# Patient Record
Sex: Male | Born: 2000 | Race: White | Hispanic: No | Marital: Single | Smoking: Never smoker
Health system: Southern US, Community
[De-identification: ages and names within clinical notes are randomized; demographics above are authoritative.]

## PROBLEM LIST (undated history)

## (undated) DIAGNOSIS — K5090 Crohn's disease, unspecified, without complications: Secondary | ICD-10-CM

## (undated) HISTORY — PX: ADENOIDECTOMY: SHX5191

## (undated) HISTORY — PX: TONSILLECTOMY: SHX5217

## (undated) HISTORY — PX: TREATMENT FISTULA ANAL: SUR1390

---
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
## 2022-04-04 ENCOUNTER — Telehealth: Payer: Self-pay

## 2022-04-04 NOTE — Telephone Encounter (Signed)
Telephone call to patient today regarding his Humira that is ready for pick up.  He plans to pick up medication next week. Dahlia Bailiff, RN

## 2022-04-08 ENCOUNTER — Telehealth: Payer: Self-pay

## 2022-04-08 NOTE — Telephone Encounter (Signed)
Telephone call to patient to inquire about which day this week he planned to pick up his Humira medication.  Patient plans to pick up medication tomorrow around 10 am.  Dahlia Bailiff, RN

## 2022-04-09 NOTE — Telephone Encounter (Signed)
Humira medication picked up by patient today. Patient instructions included.   Patient reported he had proper transporting supplies for medication (cooler packs and cooler).  Dahlia Bailiff, RN

## 2022-07-02 ENCOUNTER — Telehealth: Payer: Self-pay

## 2022-07-02 NOTE — Telephone Encounter (Signed)
Telephone call to patient today regarding his Humira medication delivery today.  Left message for patient to please return my call at [phone].  Dahlia Bailiff, RN

## 2022-07-02 NOTE — Telephone Encounter (Signed)
Return call by patient today.  I notified him that his Humira was delivered today and he plans to come within the next 30 minutes to pick it up.  Dahlia Bailiff, RN

## 2022-07-04 ENCOUNTER — Other Ambulatory Visit: Payer: Self-pay

## 2022-07-04 ENCOUNTER — Encounter: Payer: Self-pay | Admitting: Medical

## 2022-07-04 ENCOUNTER — Ambulatory Visit (INDEPENDENT_AMBULATORY_CARE_PROVIDER_SITE_OTHER): Payer: BLUE CROSS/BLUE SHIELD | Admitting: Medical

## 2022-07-04 VITALS — BP 121/81 | HR 96 | Temp 98.5°F | Ht 68.9 in | Wt 137.0 lb

## 2022-07-04 DIAGNOSIS — J101 Influenza due to other identified influenza virus with other respiratory manifestations: Secondary | ICD-10-CM | POA: Diagnosis not present

## 2022-07-04 LAB — POC SOFIA 2 FLU + SARS ANTIGEN FIA
Influenza A, POC: POSITIVE — AB
Influenza B, POC: NEGATIVE
SARS Coronavirus 2 Ag: NEGATIVE

## 2022-07-04 MED ORDER — OSELTAMIVIR PHOSPHATE 75 MG PO CAPS: 75.0000 mg | ORAL_CAPSULE | Freq: Two times a day (BID) | ORAL | 0 refills | Status: AC

## 2022-07-04 NOTE — Patient Instructions (Signed)
You have tested positive for Influenza. Influenza is contagious to others through respiratory droplets (i.e. coughing and sneezing). You should avoid close contact with others when possible until your symptoms resolve. We recommend you do not attend class until your fever (100.4 F or above) has resolved for at least 24 hours (without taking fever-reducing medicine). In the meantime, when you must leave your room/apartment (to get food or medicine, visit bathroom), you should wear a well-fitting mask (ideally N95 or KN95).  Even when you do return to class, you should continue wearing a mask when around others (i.e. when going to class or visiting public places on or off campus) until your symptoms resolve.   When you do need to miss class due to illness, you should email your professors directly to notify them of your illness and make arrangements to complete any missed work.  If you have concerns about missing class or need other assistance, email [employer] [place] [employer] at [email or web address]. You can also look at their website: [email or web address]  -Rest and stay well hydrated (by drinking water and other liquids). Avoid/limit caffeine. -Take Tamiflu with food every 12 hours for 5 days. -Take over-the-counter medicines (i.e. Mucinex Fast Max All-in-One Day/Night) to help relieve your symptoms. -For your sore throat/cough, use cough drops/throat lozenges, gargle warm salt water and/or drink warm liquids (like tea with honey). -Send MyChart message to provider or schedule return visit as needed for new/worsening symptoms (i.e. shortness of breath, ear pain) or if your symptoms do not improve as discussed with recommended treatment over the next 5-7 days.

## 2022-07-04 NOTE — Progress Notes (Signed)
[address] 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  Sx began 3 nights ago. Has had cough, fatigue, nasal congestion, runny nose, mild sore throat and HA. Had fever few days ago, still noticing chills/sweats when Tylenol wears off. No myalgias. Some nausea, vomited once yesterday. Some liquid stools in last 24 hours. Had had decreased appetite. Denies shortness of breath or wheezing. Classmate he sits next to has flu.   Thinks he had flu shot. Taking Tylenol, last dose about 3.5 hrs ago. On Humira for Crohn's, last dose 3 days ago.   Current Medication:  Outpatient Encounter Medications as of 07/04/2022  Medication Sig   HUMIRA PEN 40 MG/0.4ML PNKT Inject 40 mg into the skin See admin instructions. Every 2 weeks   predniSONE (DELTASONE) 10 MG tablet Take 10 mg by mouth See admin instructions. Qd x 30 days   [DISCONTINUED] Cholecalciferol (VITAMIN D3) 1.25 MG (50000 UT) CAPS Take 50,000 mg by mouth every Sunday.   [DISCONTINUED] amoxicillin-clavulanate (AUGMENTIN) 875-125 MG tablet Take 1 tablet by mouth 2 (two) times daily. One po bid x 7 days   [DISCONTINUED] budesonide (ENTOCORT EC) 3 MG 24 hr capsule Take 9 mg by mouth every morning.   [DISCONTINUED] CREON 12000-38000 units CPEP capsule Take 12,000 Units by mouth daily as needed (gluten allergy).   [DISCONTINUED] doxycycline (VIBRAMYCIN) 100 MG capsule Take 1 capsule (100 mg total) by mouth 2 (two) times daily. One po bid x 7 days   [DISCONTINUED] mesalamine (APRISO) 0.375 g 24 hr capsule Take 3 capsules by mouth daily.   [DISCONTINUED] omeprazole (PRILOSEC) 40 MG capsule Take 40 mg by mouth 2 (two) times  daily.   [DISCONTINUED] oxyCODONE-acetaminophen (PERCOCET) 5-325 MG tablet Take 1 tablet by mouth every 8 (eight) hours as needed for severe pain.   No facility-administered encounter medications on file as of 07/04/2022.      Medical History: Past Medical History:  Diagnosis Date   Crohn disease ([place])      Vital Signs: BP 121/81   Pulse 96   Temp 98.5 F (36.9 C) (Tympanic)   Ht 5' 8.9" (1.75 m)   Wt 137 lb (62.1 kg)   SpO2 97%   BMI 20.29 kg/m    Review of Systems See HPI  Physical Exam Vitals reviewed.  Constitutional:      General: He is not in acute distress.    Appearance: He is ill-appearing (mildly).  HENT:     Head: Normocephalic.     Right Ear: Ear canal and external ear normal.     Left Ear: Ear canal and external ear normal.     Ears:     Comments: TMs slightly dull    Nose: Mucosal edema, congestion and rhinorrhea present. Rhinorrhea is clear.     Mouth/Throat:     Mouth: Mucous membranes are moist. No oral lesions.     Pharynx: Posterior oropharyngeal erythema (mild) present. No pharyngeal swelling.     Comments: Tonsils surgically absent Cardiovascular:     Rate and Rhythm: Normal rate and regular rhythm.     Heart sounds: No murmur heard.    No friction rub. No gallop.  Pulmonary:     Effort: Pulmonary effort is normal.     Breath sounds: No decreased air movement. No rhonchi or rales.     Comments: Few scattered short wheezes bilaterally. Musculoskeletal:     Cervical back: Neck supple. No rigidity.  Lymphadenopathy:     Cervical: Cervical adenopathy (1+ anterior nodes, slightly tender) present.  Neurological:     Mental Status: He is alert.     Results for orders placed or performed in visit on 07/04/22 (from the past 24 hour(s))  POC SOFIA 2 FLU + SARS ANTIGEN FIA     Status: Abnormal   Collection Time: 07/04/22 10:53 AM  Result Value Ref Range   Influenza A, POC Positive (A) Negative   Influenza B, POC Negative Negative   SARS  Coronavirus 2 Ag Negative Negative     Assessment/Plan: 1. Influenza A POC Influenza A test positive. Clinical findings consistent with flu. Discussed potential benefit and adverse effects of Tamiflu.  Patient elected to take antiviral treatment. Encouraged supportive measures and over-the-counter meds as needed for symptom relief.  Patient declined albuterol inhaler.   - POC SOFIA 2 FLU + SARS ANTIGEN FIA - oseltamivir (TAMIFLU) 75 MG capsule; Take 1 capsule (75 mg total) by mouth 2 (two) times daily for 5 days.  Dispense: 10 capsule; Refill: 0   Patient Instructions:  You have tested positive for Influenza. Influenza is contagious to others through respiratory droplets (i.e. coughing and sneezing). You should avoid close contact with others when possible until your symptoms resolve. We recommend you do not attend class until your fever (100.4 F or above) has resolved for at least 24 hours (without taking fever-reducing medicine). In the meantime, when you must leave your room/apartment (to get food or medicine, visit bathroom), you should wear a well-fitting mask (ideally N95 or KN95).  Even when you do return to class, you should continue wearing a mask when around others (i.e. when going to class or visiting public places on or off campus) until your symptoms resolve.   When you do need to miss class due to illness, you should email your professors directly to notify them of your illness and make arrangements to complete any missed work.  If you have concerns about missing class or need other assistance, email [place] [employer] at [email or web address]. You can also look at their website: [email or web address]  -Rest and stay well hydrated (by drinking water and other liquids). Avoid/limit caffeine. -Take Tamiflu with food every 12 hours for 5 days. -Take over-the-counter medicines (i.e.  Mucinex Fast Max All-in-One Day/Night) to help relieve your symptoms. -For your sore throat/cough, use cough drops/throat lozenges, gargle warm salt water and/or drink warm liquids (like tea with honey). -Send MyChart message to provider or schedule return visit as needed for new/worsening symptoms (i.e. shortness of breath, ear pain) or if your symptoms do not improve as discussed with recommended treatment over the next 5-7 days.    General Counseling: dionysios pfitzer understanding of the findings of todays visit and agrees with plan of treatment. he has been encouraged to call the office with any questions or concerns that should arise related to todays visit.   Orders Placed This Encounter  Procedures   POC SOFIA 2 FLU + SARS ANTIGEN FIA    No orders of the defined types were placed in this encounter.   Time spent:[address] PA-C [place] 07/04/2022 10:29 AM

## 2022-07-09 ENCOUNTER — Encounter: Payer: Self-pay | Admitting: Medical

## 2022-07-09 ENCOUNTER — Ambulatory Visit (INDEPENDENT_AMBULATORY_CARE_PROVIDER_SITE_OTHER): Payer: BLUE CROSS/BLUE SHIELD | Admitting: Medical

## 2022-07-09 ENCOUNTER — Other Ambulatory Visit: Payer: Self-pay

## 2022-07-09 VITALS — BP 99/61 | HR 103 | Temp 98.7°F | Ht 68.9 in | Wt 137.0 lb

## 2022-07-09 DIAGNOSIS — J101 Influenza due to other identified influenza virus with other respiratory manifestations: Secondary | ICD-10-CM | POA: Diagnosis not present

## 2022-07-09 DIAGNOSIS — J22 Unspecified acute lower respiratory infection: Secondary | ICD-10-CM

## 2022-07-09 DIAGNOSIS — J069 Acute upper respiratory infection, unspecified: Secondary | ICD-10-CM | POA: Diagnosis not present

## 2022-07-09 LAB — POCT RAPID STREP A (OFFICE): Rapid Strep A Screen: NEGATIVE

## 2022-07-09 MED ORDER — ALBUTEROL SULFATE HFA 108 (90 BASE) MCG/ACT IN AERS: 1.0000 | INHALATION_SPRAY | RESPIRATORY_TRACT | 0 refills | Status: DC | PRN

## 2022-07-09 MED ORDER — AMOXICILLIN-POT CLAVULANATE 875-125 MG PO TABS: 1.0000 | ORAL_TABLET | Freq: Two times a day (BID) | ORAL | 0 refills | Status: DC

## 2022-07-09 NOTE — Patient Instructions (Addendum)
-  Take complete course of antibiotics as prescribed.  Take with food.   -Use albuterol inhaler every 4-6 hours as needed for shortness of breath, wheezing, chest tightness or persistent cough.  -Rest and stay well hydrated (by drinking water and other liquids).  -Take over-the-counter medicines (i.e. Mucinex DM, Sudafed, Tylenol) to help relieve your symptoms. -For your sore throat/cough, use cough drops/throat lozenges, gargle warm salt water and/or drink warm liquids (like tea with honey). -Return for follow up visit in 48 hours. -Send MyChart message to provider or schedule earlier visit as needed for new/worsening symptoms (i.e. increased shortness of breath).

## 2022-07-09 NOTE — Progress Notes (Signed)
[address], [place] [address] Phone: [phone] Fax: [phone]   Office Visit Note  Patient Name: James Ball  Date of [id number]  Med Rec number [id number]  Date of Service: 07/09/2022  Allergies: Gluten meal and Clindamycin  Chief Complaint  Patient presents with   Sore Throat     HPI 22 YO college student presents for persistent cough and sore throat, recurring fever.  See previous note. Dx with flu 07/04/22, given Tamiflu. Finished Tamiflu yesterday. Fever had gone away, subjectively during first three days on Tamiflu. Still had sore throat, cough and congestion. Had fever again yesterday, 100.7.   Cough sometimes dry and other times feels phlegmy. Difficult to expectorate mucus. Some yellow/green nasal d/c. Has continued to have a lot of fatigue. Has had sweats at night last few nights. Sore throat has also not improved.   Still has low appetite and fatigue. Not really SOB.  Using Chloraseptic lozenges, Tylenol and saline nasal spray. Last took Tylenol about 2 hours ago.  Hx of Crohn's disease, had last dose of Humira 8 days ago.   Current Medication:  Outpatient Encounter Medications as of 07/09/2022  Medication Sig   HUMIRA PEN 40 MG/0.4ML PNKT Inject 40 mg into the skin See admin instructions. Every 2 weeks   oseltamivir (TAMIFLU) 75 MG capsule Take 1 capsule (75 mg total) by mouth 2 (two) times daily for 5 days.   No facility-administered encounter medications on file as of 07/09/2022.      Medical History: Past Medical History:  Diagnosis Date   Crohn disease ([facility])      Vital Signs: BP 99/61   Pulse (!) 103   Temp 98.7 F (37.1 C) (Tympanic)   Ht 5' 8.9" (1.75 m)   Wt 137 lb (62.1 kg)   SpO2 97%   BMI 20.29 kg/m    Review of Systems  Constitutional:  Positive for chills, fatigue and fever.  HENT:  Positive for congestion, rhinorrhea and sore throat. Negative for sinus pain.   Respiratory:  Positive for  cough. Negative for shortness of breath.   Cardiovascular:  Negative for chest pain.  Neurological:  Positive for headaches (occasional).    Physical Exam Vitals reviewed.  Constitutional:      General: He is not in acute distress.    Appearance: He is ill-appearing (mildly).  HENT:     Head: Normocephalic.     Right Ear: Ear canal and external ear normal.     Left Ear: Ear canal and external ear normal.     Ears:     Comments: TMs dull bilaterally, small serous middle ear fluid.    Nose: Mucosal edema, congestion and rhinorrhea present. Rhinorrhea is purulent.     Right Turbinates: Swollen.     Left Turbinates: Swollen.     Right Sinus: No maxillary sinus tenderness or frontal sinus tenderness.     Left Sinus: No maxillary sinus tenderness or frontal sinus tenderness.     Mouth/Throat:     Mouth: Mucous membranes are moist. No oral lesions.     Pharynx: Posterior oropharyngeal erythema (moderate) present. No pharyngeal swelling.     Comments: Tonsils surgically absent Cardiovascular:     Rate and Rhythm: Regular rhythm. Tachycardia present.     Heart sounds: No murmur heard.    No friction rub. No gallop.  Pulmonary:     Effort: Pulmonary effort is normal.     Breath sounds: Decreased air movement (to lower lung  fields bilaterally, L>R) present. Examination of the right-middle field reveals wheezing. Examination of the left-middle field reveals wheezing. Examination of the right-lower field reveals wheezing and rales. Examination of the left-lower field reveals wheezing and rales. Wheezing and rales present. No rhonchi.  Musculoskeletal:     Cervical back: Neck supple. No rigidity.  Lymphadenopathy:     Cervical: Cervical adenopathy (1+ anterior cervical nodes, mildly tender) present.  Neurological:     Mental Status: He is alert.    Results for orders placed or performed in visit on 07/09/22 (from the past 24 hour(s))  POCT rapid strep A     Status: Normal   Collection  Time: 07/09/22 10:22 AM  Result Value Ref Range   Rapid Strep A Screen Negative Negative     Assessment/Plan: 1. Acute upper respiratory infection 2. Lower respiratory infection 3. Influenza A Concerned for secondary bacterial infection. Will start Augmentin. Administered albuterol nebulizer treatment in clinic, patient noted some improvement with this. Will give albuterol inhaler to use every 4-6 hours. Discussed additional supportive and symptomatic treatment. Will have patient follow up in 48 hours to re-evaluate.  - POCT rapid strep A - amoxicillin-clavulanate (AUGMENTIN) 875-125 MG tablet; Take 1 tablet by mouth 2 (two) times daily.  Dispense: 20 tablet; Refill: 0 - albuterol (VENTOLIN HFA) 108 (90 Base) MCG/ACT inhaler; Inhale 1-2 puffs into the lungs every 4 (four) hours as needed for wheezing or shortness of breath (or cough).  Dispense: 1 each; Refill: 0      General Counseling: Merrilee Jansky understanding of the findings of todays visit and agrees with plan of treatment. he has been encouraged to call the office with any questions or concerns that should arise related to todays visit.   Orders Placed This Encounter  Procedures   POCT rapid strep A    Time spent:25 [place] PA-C [place] 07/09/2022 9:09 AM

## 2022-07-11 ENCOUNTER — Ambulatory Visit (INDEPENDENT_AMBULATORY_CARE_PROVIDER_SITE_OTHER): Payer: BLUE CROSS/BLUE SHIELD | Admitting: Medical

## 2022-07-12 ENCOUNTER — Telehealth: Payer: Self-pay

## 2022-07-12 NOTE — Telephone Encounter (Signed)
Patient called at 10:30 am on 07/12/2022 regarding missed appt on 07/11/2022. Patient reports feeling better and does not want to schedule a follow up at this time.

## 2022-08-08 IMAGING — CT CT ABD-PELV W/ CM
3 of 4 series · 11 of 46 positions shown, 16 images · IV contrast (APPLIED)
Comparison: None.

CLINICAL DATA: History of Crohn's disease. Painful lump in gluteal
folds.

EXAM:
CT ABDOMEN AND PELVIS WITH CONTRAST
TECHNIQUE: Multidetector CT imaging of the abdomen and pelvis was performed
using the standard protocol following bolus administration of
intravenous contrast.
CONTRAST:  100mL OMNIPAQUE IOHEXOL 300 MG/ML  SOLN

[Series 3: abdomen 5.0 · axial · 0.73mm/px · z∈[+794,+1154]mm · 7 of 97 slices shown, 12 images]
[im 13/97  soft-tissue]
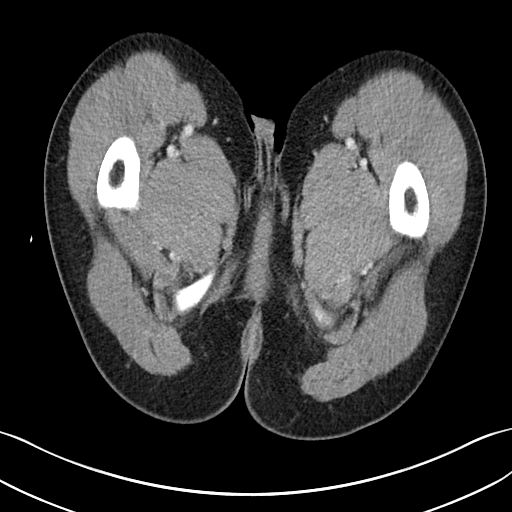
[im 13/97  bone]
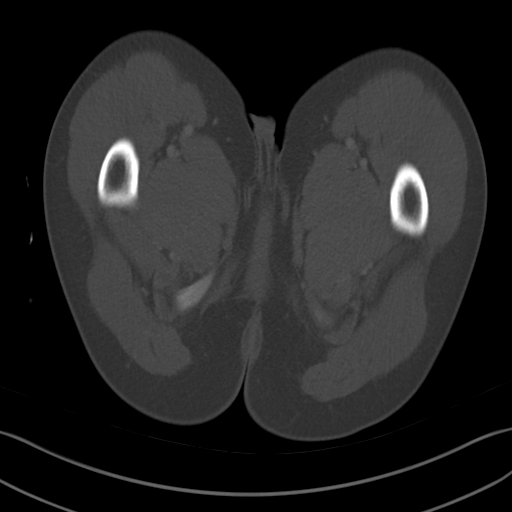
[im 25/97  soft-tissue]
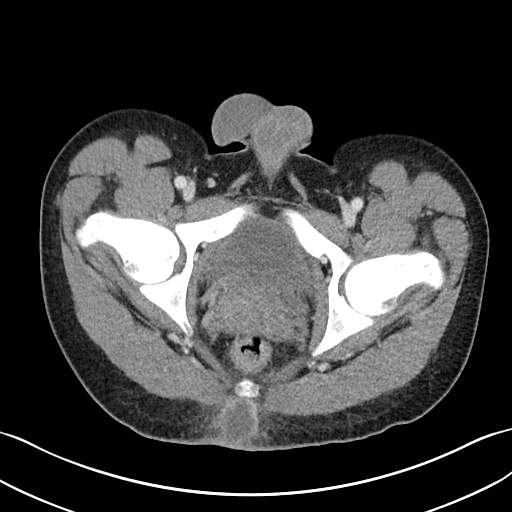
[im 37/97  soft-tissue]
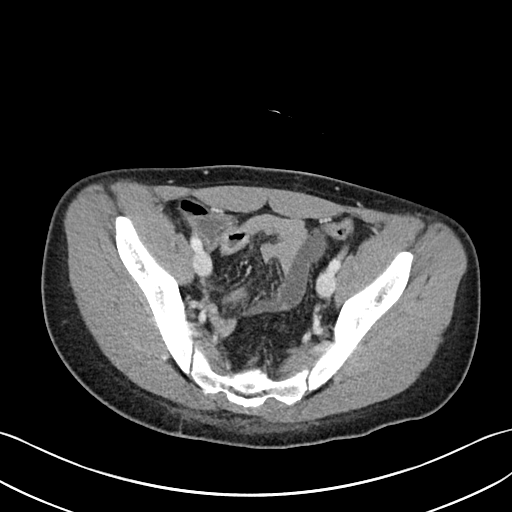
[im 49/97  soft-tissue]
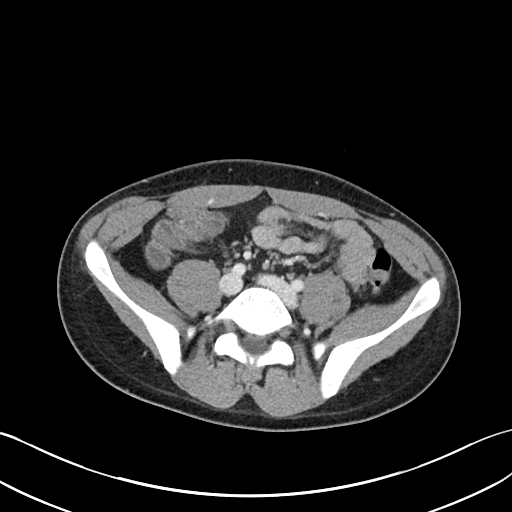
[im 49/97  lung]
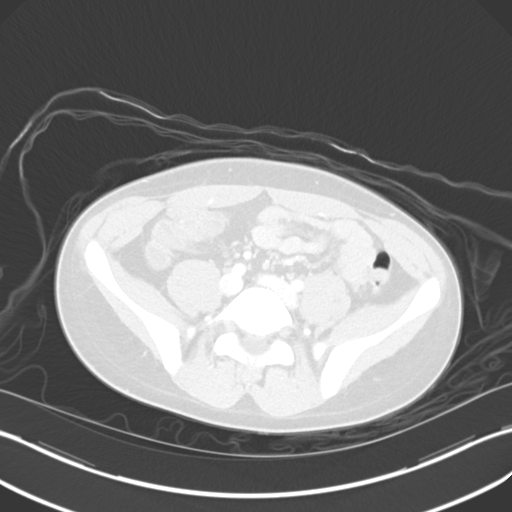
[im 61/97  soft-tissue]
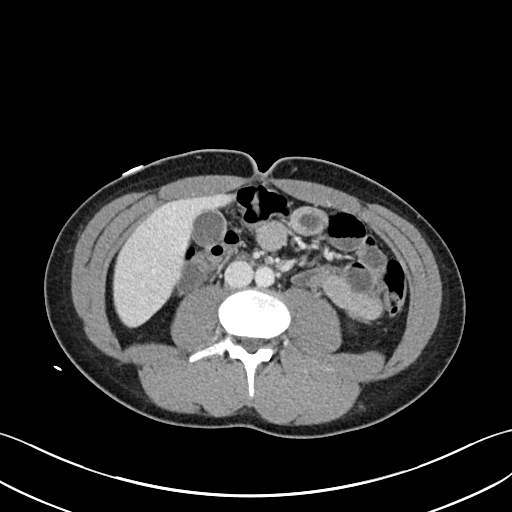
[im 61/97  lung]
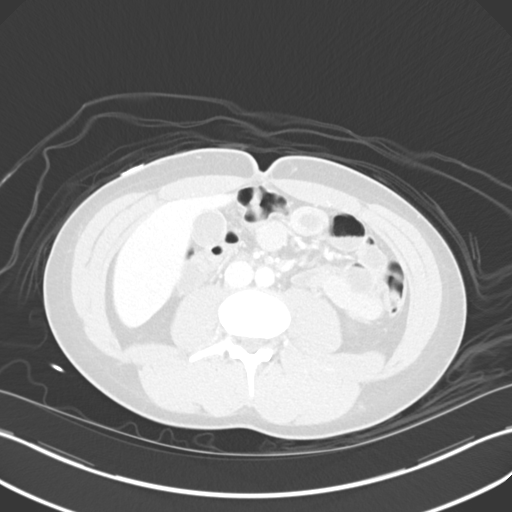
[im 73/97  soft-tissue]
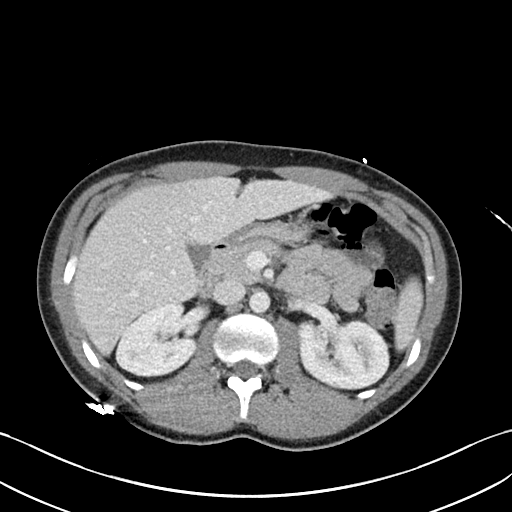
[im 73/97  lung]
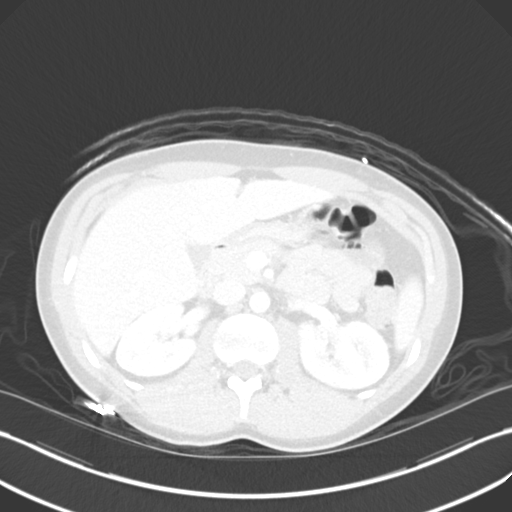
[im 85/97  soft-tissue]
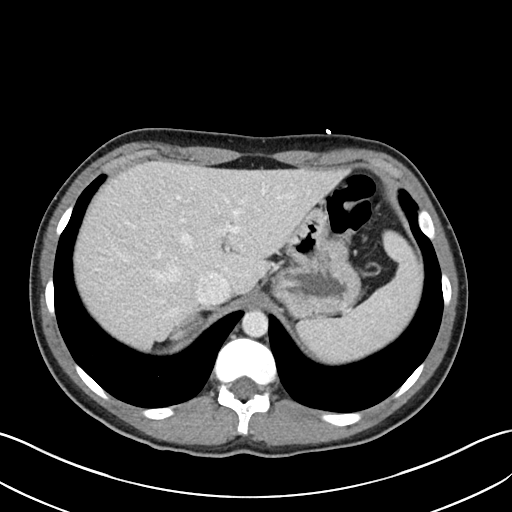
[im 85/97  lung]
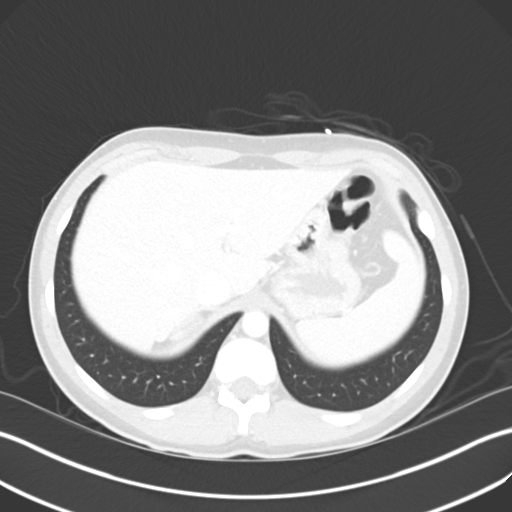

[Series 6: abdomen 3.0 mpr cor · coronal · 0.68mm/px · 3 of 112 slices shown]
[im 38/112  soft-tissue]
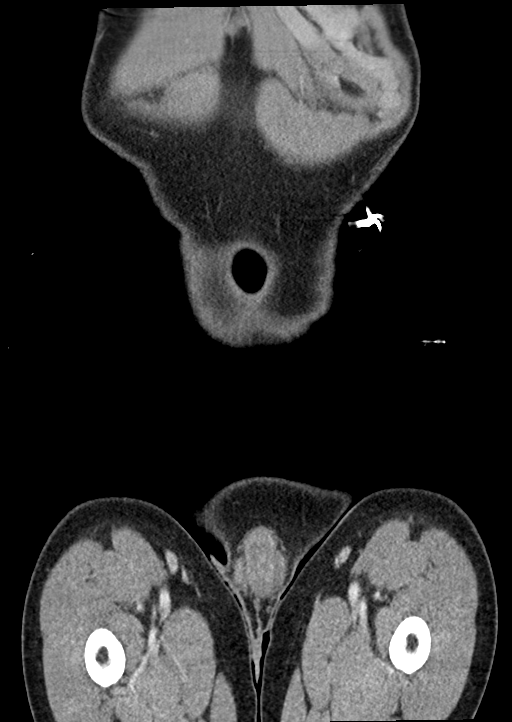
[im 50/112  soft-tissue]
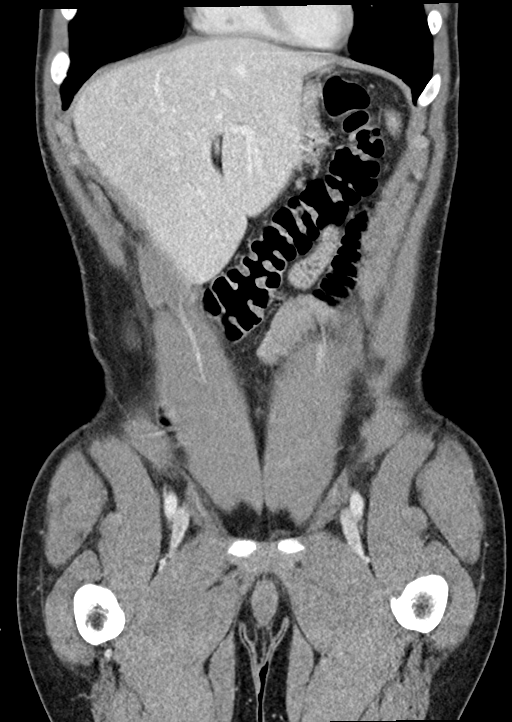
[im 62/112  soft-tissue]
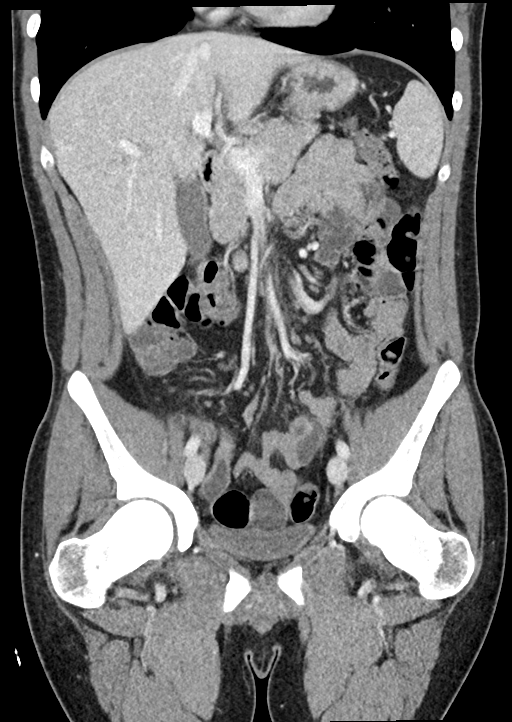

[Series 7: abdomen 3.0 mpr sag · sagittal · 0.65mm/px · 1 of 112 slices shown]
[im 38/112  soft-tissue]
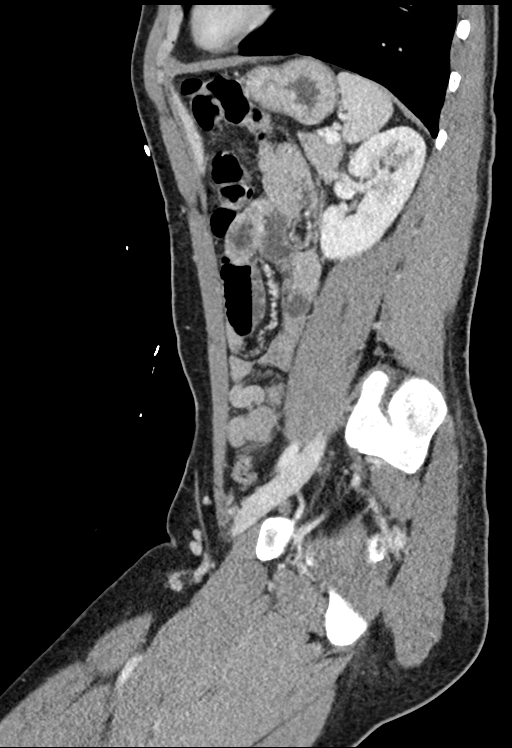

[11 of 46 positions shown; findings below may reference images not displayed]

FINDINGS: Lower chest: Lung bases are clear. No effusions. Heart is normal
size.

Hepatobiliary: No focal hepatic abnormality. Gallbladder
unremarkable.

Pancreas: No focal abnormality or ductal dilatation.

Spleen: No focal abnormality.  Normal size.

Adrenals/Urinary Tract: No adrenal abnormality. No focal renal
abnormality. No stones or hydronephrosis. Urinary bladder is
unremarkable.

Stomach/Bowel: Short segment small bowel intussusception noted in
the midline of the upper abdomen, likely transient and not of
clinical significance. No bowel obstruction. No areas of wall
thickening.

Vascular/Lymphatic: No evidence of aneurysm or adenopathy.

Reproductive: No visible focal abnormality.

Other: No free fluid or free air. Within the superior gluteal fold,
there is a fluid collection within the subcutaneous soft tissues
extending near the tip of the coccyx measuring 2.9 x 2.6 cm.
Surrounding inflammatory stranding. No visible connection to bowel
to suggest fistula.

Musculoskeletal: No acute bony abnormality.
IMPRESSION: Fluid collection within the superior gluteal fold subcutaneous soft
tissues extending down near the coccyx concerning for subcutaneous
abscess.

Short segment small bowel intussusception in the upper abdomen,
likely transient and not of relevance. No bowel obstruction.

No evidence for active Crohn's disease.

## 2022-09-03 ENCOUNTER — Encounter: Payer: Self-pay | Admitting: Oncology

## 2022-09-03 ENCOUNTER — Ambulatory Visit (INDEPENDENT_AMBULATORY_CARE_PROVIDER_SITE_OTHER): Payer: BLUE CROSS/BLUE SHIELD | Admitting: Oncology

## 2022-09-03 ENCOUNTER — Other Ambulatory Visit: Payer: Self-pay

## 2022-09-03 VITALS — HR 93 | Temp 97.8°F

## 2022-09-03 DIAGNOSIS — K50919 Crohn's disease, unspecified, with unspecified complications: Secondary | ICD-10-CM | POA: Diagnosis not present

## 2022-09-03 NOTE — Progress Notes (Signed)
[name] [address] [name], [place] [address] Phone: [phone] Fax: [phone]  Office Visit Note  Patient Name: James Ball  Date of [id number]  Med Rec number [id number]  Date of Service: 09/03/2022  Gluten meal and Clindamycin  No chief complaint on file.  Patient is an 22 y.o. student here for a lab draw.   Diagnosed with chron's disease when he was in 3rd grade. Gets labs drawn every 6 months. Does not have flares often. 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   No concerns today.  Current Medication:  Outpatient Encounter Medications as of 09/03/2022  Medication Sig   albuterol (VENTOLIN HFA) 108 (90 Base) MCG/ACT inhaler Inhale 1-2 puffs into the lungs every 4 (four) hours as needed for wheezing or shortness of breath (or cough).   amoxicillin-clavulanate (AUGMENTIN) 875-125 MG tablet Take 1 tablet by mouth 2 (two) times daily.   HUMIRA PEN 40 MG/0.4ML PNKT Inject 40 mg into the skin See admin instructions. Every 2 weeks   No facility-administered encounter medications on file as of 09/03/2022.     Medical History: Past Medical History:  Diagnosis Date   Crohn disease (HCC)    Vital Signs: There were no vitals taken for this visit.  ROS: As per HPI.  All other pertinent ROS negative.     Review of Systems  Constitutional: Negative.     Physical Exam Constitutional:      Appearance: Normal appearance.  Neurological:     Mental Status: He is alert and oriented to person, place, and time.    No results found for this or any previous visit (from the past 24 hour(s)).  Assessment/Plan: 1. Crohn's disease with complication, unspecified gastrointestinal tract location -Labs today. -Order from [facility] gastroenterology and nutrition copied and will be placed in epic. -Results are to be faxed to James Ball at [phone]. -Send results to student and provider when available.  - CBC w/Diff; Future - C-reactive protein;  Future - Sedimentation rate; Future - Comp Met (CMET); Future - Vitamin D (25 hydroxy); Future - Iron and TIBC([facility]); Future  Disposition-return to clinic as needed.  General Counseling: James Ball understanding of the findings of todays visit and agrees with plan of treatment. I have discussed any further diagnostic evaluation that may be needed or ordered today. We also reviewed his medications today. he has been encouraged to call the office with any questions or concerns that should arise related to todays visit.   No orders of the defined types were placed in this encounter.   No orders of the defined types were placed in this encounter.   I spent 20 minutes dedicated to the care of this patient (face-to-face and non-face-to-face) on the date of the encounter to include what is described in the assessment and plan.   James Hurt, NP 09/03/2022 8:27 AM

## 2022-09-04 LAB — VITAMIN D 25 HYDROXY (VIT D DEFICIENCY, FRACTURES): Vit D, 25-Hydroxy: 20.8 ng/mL — ABNORMAL LOW (ref 30.0–100.0)

## 2022-09-04 LAB — COMPREHENSIVE METABOLIC PANEL
ALT: 22 IU/L (ref 0–44)
AST: 23 IU/L (ref 0–40)
Albumin/Globulin Ratio: 1.9 (ref 1.2–2.2)
Albumin: 4.7 g/dL (ref 4.3–5.2)
Alkaline Phosphatase: 105 IU/L (ref 44–121)
BUN/Creatinine Ratio: 20 (ref 9–20)
BUN: 12 mg/dL (ref 6–20)
Bilirubin Total: <0.2 mg/dL (ref 0.0–1.2)
CO2: 19 mmol/L — ABNORMAL LOW (ref 20–29)
Calcium: 9.3 mg/dL (ref 8.7–10.2)
Chloride: 101 mmol/L (ref 96–106)
Creatinine, Ser: 0.61 mg/dL — ABNORMAL LOW (ref 0.76–1.27)
Globulin, Total: 2.5 g/dL (ref 1.5–4.5)
Glucose: 93 mg/dL (ref 70–99)
Potassium: 4.4 mmol/L (ref 3.5–5.2)
Sodium: 140 mmol/L (ref 134–144)
Total Protein: 7.2 g/dL (ref 6.0–8.5)
eGFR: 140 mL/min/{1.73_m2} (ref >59)

## 2022-09-04 LAB — IRON AND TIBC
Iron Saturation: 25 % (ref 15–55)
Iron: 95 ug/dL (ref 38–169)
Total Iron Binding Capacity: 383 ug/dL (ref 250–450)
UIBC: 288 ug/dL (ref 111–343)

## 2022-09-04 LAB — CBC WITH DIFFERENTIAL/PLATELET
Basophils Absolute: 0.1 10*3/uL (ref 0.0–0.2)
Basos: 0 %
EOS (ABSOLUTE): 0.3 10*3/uL (ref 0.0–0.4)
Eos: 2 %
Hematocrit: 41.3 % (ref 37.5–51.0)
Hemoglobin: 13.2 g/dL (ref 13.0–17.7)
Immature Grans (Abs): 0.1 10*3/uL (ref 0.0–0.1)
Immature Granulocytes: 1 %
Lymphocytes Absolute: 3.7 10*3/uL — ABNORMAL HIGH (ref 0.7–3.1)
Lymphs: 31 %
MCH: 30.1 pg (ref 26.6–33.0)
MCHC: 32 g/dL (ref 31.5–35.7)
MCV: 94 fL (ref 79–97)
Monocytes Absolute: 0.5 10*3/uL (ref 0.1–0.9)
Monocytes: 4 %
Neutrophils Absolute: 7.4 10*3/uL — ABNORMAL HIGH (ref 1.4–7.0)
Neutrophils: 62 %
Platelets: 271 10*3/uL (ref 150–450)
RBC: 4.39 x10E6/uL (ref 4.14–5.80)
RDW: 13.4 % (ref 11.6–15.4)
WBC: 12 10*3/uL — ABNORMAL HIGH (ref 3.4–10.8)

## 2022-09-04 LAB — C-REACTIVE PROTEIN: CRP: <1 mg/L (ref 0–10)

## 2022-09-04 LAB — SEDIMENTATION RATE: Sed Rate: 9 mm/hr (ref 0–15)

## 2023-03-09 ENCOUNTER — Encounter: Payer: Self-pay | Admitting: Adult Health

## 2023-03-09 ENCOUNTER — Ambulatory Visit (INDEPENDENT_AMBULATORY_CARE_PROVIDER_SITE_OTHER): Payer: BLUE CROSS/BLUE SHIELD | Admitting: Adult Health

## 2023-03-09 VITALS — HR 85 | Temp 99.3°F | Wt 147.0 lb

## 2023-03-09 DIAGNOSIS — H6690 Otitis media, unspecified, unspecified ear: Secondary | ICD-10-CM

## 2023-03-09 DIAGNOSIS — J029 Acute pharyngitis, unspecified: Secondary | ICD-10-CM | POA: Diagnosis not present

## 2023-03-09 LAB — POC SOFIA 2 FLU + SARS ANTIGEN FIA
Influenza A, POC: NEGATIVE
Influenza B, POC: NEGATIVE
SARS Coronavirus 2 Ag: NEGATIVE

## 2023-03-09 MED ORDER — AMOXICILLIN-POT CLAVULANATE 875-125 MG PO TABS
1.0000 | ORAL_TABLET | Freq: Two times a day (BID) | ORAL | 0 refills | Status: DC
Start: 2023-03-09 — End: 2023-03-21

## 2023-03-09 NOTE — Progress Notes (Signed)
[facility] [address] [place], [place] [address] Phone: [phone] Fax: [phone]   Office Visit Note  Patient Name: James Ball  Date of [id number]  Med Rec number [id number]  Date of Service: 03/09/2023  Gluten meal and Clindamycin  Chief Complaint  Patient presents with   Acute Visit     HPI  Patient reports he started feeling bad 2 days ago.  He describes sore throat, left ear clogged, Nausea (he had some issues eating over the weekend), cough and mild head congestion, and PND.  He has taken Tylenol with mild improvement.   Current Medication:  Outpatient Encounter Medications as of 03/09/2023  Medication Sig   HUMIRA PEN 40 MG/0.4ML PNKT Inject 40 mg into the skin See admin instructions. Every 2 weeks   No facility-administered encounter medications on file as of 03/09/2023.      Medical History: Past Medical History:  Diagnosis Date   Crohn disease (HCC)      Vital Signs: Pulse 85   Temp 99.3 F (37.4 C) (Tympanic)   Wt 147 lb (66.7 kg)   SpO2 98%   BMI 21.77 kg/m    Review of Systems  Constitutional:  Negative for chills, fatigue and fever.  HENT:  Positive for congestion, ear pain, postnasal drip and sore throat.   Eyes:  Negative for pain and itching.  Respiratory:  Positive for cough.   Gastrointestinal:  Positive for nausea.  Neurological:  Positive for headaches.    Physical Exam HENT:     Right Ear: Tympanic membrane and ear canal normal. Tympanic membrane is not injected or erythematous.     Left Ear: Decreased hearing noted. Tenderness present. Tympanic membrane is injected and erythematous.    Assessment/Plan: 1. Acute otitis media, unspecified otitis media type Take complete course of antibiotics as prescribed.  Take with food.  If symptoms fail to improve, or new/worse symptoms develop follow up in clinic.   - amoxicillin-clavulanate (AUGMENTIN) 875-125 MG tablet; Take 1 tablet by mouth 2 (two) times  daily.  Dispense: 20 tablet; Refill: 0  2. Sore throat - POC SOFIA 2 FLU + SARS ANTIGEN FIA     General Counseling: stoney karczewski understanding of the findings of todays visit and agrees with plan of treatment. I have discussed any further diagnostic evaluation that may be needed or ordered today. We also reviewed his medications today. he has been encouraged to call the office with any questions or concerns that should arise related to todays visit.   Orders Placed This Encounter  Procedures   POC SOFIA 2 FLU + SARS ANTIGEN FIA    No orders of the defined types were placed in this encounter.   Time spent:15 Minutes Time spent includes review of chart, medications, test results, and follow up plan with the patient.    Johnna Acosta AGNP-C Nurse Practitioner

## 2023-03-13 ENCOUNTER — Encounter: Payer: Self-pay | Admitting: Adult Health

## 2023-03-15 ENCOUNTER — Other Ambulatory Visit: Payer: Self-pay | Admitting: Physician Assistant

## 2023-03-15 DIAGNOSIS — R051 Acute cough: Secondary | ICD-10-CM

## 2023-03-15 MED ORDER — ALBUTEROL SULFATE HFA 108 (90 BASE) MCG/ACT IN AERS
2.0000 | INHALATION_SPRAY | Freq: Four times a day (QID) | RESPIRATORY_TRACT | 0 refills | Status: DC | PRN
Start: 2023-03-15 — End: 2023-07-09

## 2023-03-15 NOTE — Progress Notes (Signed)
Inhaler sent to requested pharmacy. See MyChart msg

## 2023-03-19 ENCOUNTER — Encounter: Payer: Self-pay | Admitting: Physician Assistant

## 2023-03-19 ENCOUNTER — Ambulatory Visit (INDEPENDENT_AMBULATORY_CARE_PROVIDER_SITE_OTHER): Payer: BLUE CROSS/BLUE SHIELD | Admitting: Physician Assistant

## 2023-03-19 VITALS — HR 83 | Temp 99.8°F

## 2023-03-19 DIAGNOSIS — H6690 Otitis media, unspecified, unspecified ear: Secondary | ICD-10-CM | POA: Diagnosis not present

## 2023-03-19 NOTE — Progress Notes (Signed)
[facility] [address] [place], [place] [address] Phone: [phone] Fax: [phone]   Office Visit Note  Patient Name: James Ball  Date of [id number]  Med Rec number [id number]   Gluten meal and Clindamycin  Chief Complaint  Patient presents with   Ear Pain   Wants to double check his L ear - mother also wanted to check  Finished his Augmentin yesterday  Feels like it is clogged and still cannot hear sometimes when wakes in the morning or when lying down - left ear   Cough - clear phlegm Nasal congestion - clear   Current Medication:  Outpatient Encounter Medications as of 03/19/2023  Medication Sig   albuterol (VENTOLIN HFA) 108 (90 Base) MCG/ACT inhaler Inhale 2 puffs into the lungs every 6 (six) hours as needed for wheezing or shortness of breath.   HUMIRA PEN 40 MG/0.4ML PNKT Inject 40 mg into the skin See admin instructions. Every 2 weeks   [DISCONTINUED] amoxicillin-clavulanate (AUGMENTIN) 875-125 MG tablet Take 1 tablet by mouth 2 (two) times daily. (Patient not taking: Reported on 03/19/2023)   No facility-administered encounter medications on file as of 03/19/2023.      Medical History: Past Medical History:  Diagnosis Date   Crohn disease (HCC)      Vital Signs: Pulse 83   Temp 99.8 F (37.7 C) (Tympanic)   SpO2 97%    ROS negative unless otherwise indicated above.  Physical Exam Vitals reviewed.  HENT:     Right Ear: Ear canal and external ear normal. No laceration, drainage, swelling or tenderness. There is no impacted cerumen. No foreign body. Tympanic membrane is erythematous. Tympanic membrane is not injected or perforated.     Left Ear: Ear canal and external ear normal. No laceration, drainage, swelling or tenderness. There is no impacted cerumen. No foreign body. Tympanic membrane is injected and erythematous. Tympanic membrane is not perforated.     Ears:     Comments: Discussed that his ear is not entirely back to  normal and that if still has sx this weekend, I need a message so I can prescribe an abx    Nose: Nose normal. No mucosal edema, congestion or rhinorrhea.     Right Nostril: No epistaxis.     Left Nostril: No epistaxis.     Right Turbinates: Not enlarged or swollen.     Left Turbinates: Not enlarged or swollen.     Right Sinus: No maxillary sinus tenderness or frontal sinus tenderness.     Left Sinus: No maxillary sinus tenderness or frontal sinus tenderness.     Mouth/Throat:     Pharynx: No oropharyngeal exudate or posterior oropharyngeal erythema.     Tonsils: No tonsillar exudate or tonsillar abscesses.  Eyes:     Pupils: Pupils are equal, round, and reactive to light.  Cardiovascular:     Rate and Rhythm: Normal rate and regular rhythm.     Heart sounds: Normal heart sounds. No murmur heard.    No friction rub.  Pulmonary:     Effort: Pulmonary effort is normal. No respiratory distress.     Breath sounds: No stridor. Wheezing present. No rhonchi or rales.  Musculoskeletal:     Cervical back: No tenderness.  Lymphadenopathy:     Cervical: No cervical adenopathy.  Neurological:     Mental Status: He is alert.  Psychiatric:        Behavior: Behavior normal.     No results found for this or any  previous visit (from the past 24 hour(s)).   Assessment/Plan:  1. Acute otitis media, unspecified otitis media type   Discussed that his ear is not entirely back to normal and that if still has sx this weekend, I need a message so I can prescribe another abx   General Counseling: queshawn simcik understanding of the findings of todays visit and agrees with plan of treatment. I have discussed any further diagnostic evaluation that may be needed or ordered today. We also reviewed his medications today. he has been encouraged to call the office with any questions or concerns that should arise related to todays visit.   No orders of the defined types were placed in this  encounter.   No orders of the defined types were placed in this encounter.     Signed, Lennon Alstrom, PA-C 03/25/2023, 5:39 AM

## 2023-03-21 ENCOUNTER — Other Ambulatory Visit: Payer: Self-pay | Admitting: Physician Assistant

## 2023-03-21 DIAGNOSIS — H9209 Otalgia, unspecified ear: Secondary | ICD-10-CM

## 2023-03-21 DIAGNOSIS — H66005 Acute suppurative otitis media without spontaneous rupture of ear drum, recurrent, left ear: Secondary | ICD-10-CM

## 2023-03-21 MED ORDER — FLUOCINOLONE ACETONIDE 0.01 % EX SOLN
CUTANEOUS | 0 refills | Status: DC
Start: 2023-03-21 — End: 2023-07-09

## 2023-03-21 MED ORDER — CEPHALEXIN 500 MG PO CAPS
500.0000 mg | ORAL_CAPSULE | Freq: Two times a day (BID) | ORAL | 0 refills | Status: AC
Start: 2023-03-21 — End: 2023-03-31

## 2023-03-21 NOTE — Progress Notes (Signed)
Rx sent to pharmacy   

## 2023-07-09 ENCOUNTER — Encounter: Payer: Self-pay | Admitting: Medical

## 2023-07-09 ENCOUNTER — Other Ambulatory Visit: Payer: Self-pay

## 2023-07-09 ENCOUNTER — Ambulatory Visit (INDEPENDENT_AMBULATORY_CARE_PROVIDER_SITE_OTHER): Payer: BLUE CROSS/BLUE SHIELD | Admitting: Medical

## 2023-07-09 VITALS — BP 104/68 | HR 88 | Temp 98.0°F

## 2023-07-09 DIAGNOSIS — J069 Acute upper respiratory infection, unspecified: Secondary | ICD-10-CM

## 2023-07-09 DIAGNOSIS — R062 Wheezing: Secondary | ICD-10-CM | POA: Diagnosis not present

## 2023-07-09 LAB — POC COVID19/FLU A&B COMBO
Covid Antigen, POC: NEGATIVE
Influenza A Antigen, POC: NEGATIVE
Influenza B Antigen, POC: NEGATIVE

## 2023-07-09 LAB — POCT RAPID STREP A (OFFICE): Rapid Strep A Screen: NEGATIVE

## 2023-07-09 MED ORDER — ALBUTEROL SULFATE HFA 108 (90 BASE) MCG/ACT IN AERS: 1.0000 | INHALATION_SPRAY | RESPIRATORY_TRACT | 0 refills | Status: DC | PRN

## 2023-07-09 NOTE — Progress Notes (Signed)
 [facility] [address] [place], [place] [address] Phone: [phone] Fax: [phone]   Office Visit Note  Patient Name: James Ball  Date of [id number]  Med Rec number [id number]  Date of Service: 07/09/2023  Allergies: Gluten meal and Clindamycin  Chief Complaint  Patient presents with   Acute Visit     HPI 23 y.o. college student presents with respiratory symptoms.  Sx began 2 days ago with nasal congestion, sneezing, cough. Mild sore throat. Had chills first day, no fever he knows of. Clear nasal d/c, not much mucus when coughing. No SOB. No myalgias or HA. No nausea, vomiting, diarrhea. Mild fatigue but not missing class.   Had pneumonia in Nov/Dec. Was treated as outpatient.  Takes Humira for Crohn's disease.  Using a nasal spray, saline. Also cough drops/throat lozenges.  Has contacts who are sick, not sure with what. Does not think he had flu shot.  Some wheezing noted yesterday. No hx of asthma. Thinks he had wheezing with PNA.    Current Medication:  Outpatient Encounter Medications as of 07/09/2023  Medication Sig   HUMIRA PEN 40 MG/0.4ML PNKT Inject 40 mg into the skin See admin instructions. Every 2 weeks   [DISCONTINUED] albuterol (VENTOLIN HFA) 108 (90 Base) MCG/ACT inhaler Inhale 2 puffs into the lungs every 6 (six) hours as needed for wheezing or shortness of breath. (Patient not taking: Reported on 07/09/2023)   [DISCONTINUED] fluocinolone (SYNALAR) 0.01 % external solution Apply 5 drops into affected ear twice daily for 7 to 14 days (Patient not taking: Reported on 07/09/2023)   No facility-administered encounter medications on file as of 07/09/2023.      Medical History: Past Medical History:  Diagnosis Date   Crohn disease (HCC)      Vital Signs: BP 104/68   Pulse 88   Temp 98 F (36.7 C) (Tympanic)   SpO2 98%    Review of Systems See HPI  Physical Exam Vitals reviewed.  Constitutional:      General: He is not in  acute distress.    Appearance: He is not ill-appearing.     Comments: Tired appearing  HENT:     Head: Normocephalic.     Right Ear: Ear canal and external ear normal.     Left Ear: Ear canal and external ear normal.     Ears:     Comments: TMs dull bilaterally    Nose: Mucosal edema, congestion and rhinorrhea present. Rhinorrhea is clear.     Mouth/Throat:     Mouth: Mucous membranes are moist. No oral lesions.     Pharynx: Uvula midline. Posterior oropharyngeal erythema (mild-moderate) present. No pharyngeal swelling or uvula swelling.     Comments: Tonsils surgically absent Cardiovascular:     Rate and Rhythm: Normal rate and regular rhythm.     Heart sounds: No murmur heard.    No friction rub. No gallop.  Pulmonary:     Effort: Pulmonary effort is normal. Prolonged expiration (mild) present. No respiratory distress.     Breath sounds: Wheezing and rhonchi present. No rales.     Comments: Mild wheezing and rhonchi bilaterally, diffuse Musculoskeletal:     Cervical back: Neck supple. No rigidity.  Lymphadenopathy:     Cervical: Cervical adenopathy (small anterior nodes) present.  Neurological:     Mental Status: He is alert.     Results for orders placed or performed in visit on 07/09/23 (from the past 24 hours)  POC Covid19/Flu A&B Antigen  Status: Normal   Collection Time: 07/09/23 11:34 AM  Result Value Ref Range   Influenza A Antigen, POC Negative Negative   Influenza B Antigen, POC Negative Negative   Covid Antigen, POC Negative Negative  POCT rapid strep A     Status: Normal   Collection Time: 07/09/23 11:35 AM  Result Value Ref Range   Rapid Strep A Screen Negative Negative     Assessment/Plan: 1. Acute upper respiratory infection (Primary) 2. Bilateral wheezing  - POCT rapid strep A - albuterol (VENTOLIN HFA) 108 (90 Base) MCG/ACT inhaler; Inhale 1-2 puffs into the lungs every 4 (four) hours as needed for wheezing or shortness of breath (or cough).   Dispense: 1 each; Refill: 0 - POC Covid19/Flu A&B Antigen  Most likely viral.  POC COVID-19/influenza/strep A antigen tests negative.  Offered bloodwork for more definitive dx. Pt prefers to defer this. Will give albuterol inhaler to take Q4 hrs. Will have patient follow up in 4 days to recheck lungs.  Patient Instructions  -Rest and stay well hydrated (by drinking water and other liquids). Avoid/limit caffeine. -Take over-the-counter medicines (i.e. Mucinex day/night cold and flu medicine, Ibuprofen) to help relieve your symptoms. -Use albuterol inhaler every 4 hours as needed for shortness of breath, wheezing, chest tightness or persistent cough. -For your sore throat/cough, use cough drops/throat lozenges, gargle warm salt water and/or drink warm liquids (like tea with honey).  -Follow-up in 4 days to recheck lungs. -Send MyChart message to provider or schedule earlier visit as needed for new/worsening symptoms.     General Counseling: jovann luse understanding of the findings of todays visit and agrees with plan of treatment. he has been encouraged to call the office with any questions or concerns that should arise related to todays visit.    Jonathon Resides PA-C [employer] 07/09/2023 11:13 AM

## 2023-08-02 NOTE — Patient Instructions (Addendum)
-  Rest and stay well hydrated (by drinking water and other liquids). Avoid/limit caffeine. -Take over-the-counter medicines (i.e. Mucinex day/night cold and flu medicine, Ibuprofen) to help relieve your symptoms. -Use albuterol inhaler every 4 hours as needed for shortness of breath, wheezing, chest tightness or persistent cough. -For your sore throat/cough, use cough drops/throat lozenges, gargle warm salt water and/or drink warm liquids (like tea with honey).  -Follow-up in 4 days to recheck lungs. -Send MyChart message to provider or schedule earlier visit as needed for new/worsening symptoms.

## 2023-09-03 ENCOUNTER — Emergency Department
Admission: EM | Admit: 2023-09-03 | Discharge: 2023-09-03 | Disposition: A | Discharge status: Home / Self Care | Attending: Emergency Medicine | Admitting: Emergency Medicine

## 2023-09-03 ENCOUNTER — Other Ambulatory Visit: Payer: Self-pay

## 2023-09-03 DIAGNOSIS — L0231 Cutaneous abscess of buttock: Secondary | ICD-10-CM | POA: Insufficient documentation

## 2023-09-03 DIAGNOSIS — L0501 Pilonidal cyst with abscess: Secondary | ICD-10-CM

## 2023-09-03 MED ORDER — LIDOCAINE-EPINEPHRINE-TETRACAINE (LET) TOPICAL GEL
3.0000 mL | Freq: Once | TOPICAL | Status: AC
Start: 2023-09-03 — End: 2023-09-03
  Administered 2023-09-03: 3 mL via TOPICAL
  Filled 2023-09-03: qty 3

## 2023-09-03 MED ORDER — LIDOCAINE HCL (PF) 1 % IJ SOLN
5.0000 mL | Freq: Once | INTRAMUSCULAR | Status: AC
Start: 2023-09-03 — End: 2023-09-03
  Administered 2023-09-03: 5 mL
  Filled 2023-09-03: qty 5

## 2023-09-03 NOTE — ED Triage Notes (Signed)
 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 Pt reports it is bigger today.

## 2023-09-03 NOTE — ED Provider Notes (Signed)
 [facility] Provider Note    Event Date/Time   First MD Initiated Contact with Patient 09/03/23 2110     (approximate)   History   Cyst   HPI  James Ball is a 23 y.o. male history of Crohn's disease, pilonidal abscesses, presenting with pilonidal abscess.  Symptoms started 2 days ago.  States it feels consistent with his prior pilonidal cysts.  Went to urgent care yesterday and was discharged with Bactrim, states he took 1 day of antibiotics and felt like the swelling has gotten worse.  No fever.  No drainage.  States that he has gotten the abscess drained before.  Has not been to see a surgeon to get the cyst removed.   On independent chart review, he would urgent care yesterday, they noted a cystic lesion with mild erythema and discharging with Bactrim.  Physical Exam   Triage Vital Signs: ED Triage Vitals  Encounter Vitals Group     BP 09/03/23 2041 129/81     Systolic BP Percentile --      Diastolic BP Percentile --      Pulse Rate 09/03/23 2041 (!) 111     Resp 09/03/23 2041 18     Temp 09/03/23 2041 99.1 F (37.3 C)     Temp Source 09/03/23 2041 Oral     SpO2 09/03/23 2041 97 %     Weight 09/03/23 2040 155 lb (70.3 kg)     Height 09/03/23 2040 5\' 9"  (1.753 m)     Head Circumference --      Peak Flow --      Pain Score 09/03/23 2040 5     Pain Loc --      Pain Education --      Exclude from Growth Chart --     Most recent vital signs: Vitals:   09/03/23 2041  BP: 129/81  Pulse: (!) 111  Resp: 18  Temp: 99.1 F (37.3 C)  SpO2: 97%     General: Awake, no distress.  CV:  Good peripheral perfusion.  Resp:  Normal effort.  Abd:  No distention.  Other:  1 cm area of fluctuance with some surrounding air induration and erythema.   ED Results / Procedures / Treatments   Labs (all labs ordered are listed, but only abnormal results are displayed) Labs Reviewed - No data to display    PROCEDURES:  Critical Care performed:  No  .Ultrasound ED Soft Tissue  Date/Time: 09/03/2023 9:51 PM  Performed by: Claybon Jabs, MD Authorized by: Claybon Jabs, MD   Procedure details:    Indications: localization of abscess     Transverse view:  Visualized   Longitudinal view:  Visualized   Images: not archived   Location:    Location: buttocks     Side:  Midline Findings:     abscess present    cellulitis present .Incision and Drainage  Date/Time: 09/03/2023 10:29 PM  Performed by: Claybon Jabs, MD Authorized by: Claybon Jabs, MD   Consent:    Consent obtained:  Verbal   Consent given by:  Patient   Risks discussed:  Bleeding, pain and incomplete drainage Location:    Type:  Abscess   Size:  1x4cm   Location:  Anogenital   Anogenital location:  Gluteal cleft Procedure type:    Complexity:  Complex Procedure details:    Needle aspiration: no     Incision types:  Stab incision   Wound management:  Probed and deloculated   Drainage:  Bloody and purulent   Drainage amount:  Copious   Wound treatment:  Wound left open   Packing materials:  None Post-procedure details:    Procedure completion:  Tolerated well, no immediate complications    MEDICATIONS ORDERED IN ED: Medications  lidocaine (PF) (XYLOCAINE) 1 % injection 5 mL (5 mLs Other Given 09/03/23 2158)  lidocaine-EPINEPHrine-tetracaine (LET) topical gel (3 mLs Topical Given 09/03/23 2158)     IMPRESSION / MDM / ASSESSMENT AND PLAN / ED COURSE  I reviewed the triage vital signs and the nursing notes.                              Differential diagnosis includes, but is not limited to, abscess, cellulitis.  Will plan to do an I&D.  Will place LET over the spot.  Will have him continue his Bactrim and follow-up with surgery outpatient.  Patient's presentation is most consistent with acute presentation with potential threat to life or bodily function.  I&D was completed without complications, patient felt significantly better after the abscess was  drained.  Aftercare instructions were provided to patient.  Also gave him follow-up number with surgery so they can set up follow-up as needed.  Considered but no indication for additional workup or inpatient admission at this time, he safe for outpatient management.  Instructed him to complete his course of Bactrim.  Strict return precautions given.  Discharge.      FINAL CLINICAL IMPRESSION(S) / ED DIAGNOSES   Final diagnoses:  Pilonidal abscess     Rx / DC Orders   ED Discharge Orders     None        Note:  This document was prepared using Dragon voice recognition software and may include unintentional dictation errors.    Claybon Jabs, MD 09/03/23 [phone]

## 2023-09-03 NOTE — Discharge Instructions (Addendum)
 Please complete the course of Bactrim that was prescribed by the urgent care doctor yesterday.  You can try sitz bath's, you can wash with soap and water.  You can use Tylenol and ibuprofen every 6 hours as needed for pain.  If you notice increased redness, swelling, fever, please come back to be seen.

## 2023-09-09 NOTE — Progress Notes (Unsigned)
 Patient ID: James Ball, male   DOB: [birth year], 23 y.o.   MRN: [id number]  Chief Complaint: Pilonidal abscess  History of Present Illness James Ball is a 23 y.o. male with a 2-day history of pilonidal abscess presented to the ED for incision and drainage.  This was on September 03, 2023.  Apparently had been seen prior and started on antibiotics but it progressed.  He denies any fever or chills.  He had previous abscess I&D done in the past, had never followed up with surgeon for definitive treatment. He has kept the area clean in the shower, has encouraged as much drainage is feasible to occur over the interval.  Has not been packing it.  Denies any pain or tenderness currently.  Denies any lingering fevers or chills.  Since his recent I&D he appears to have made excellent progress, and healing, without packing.  Past Medical History Past Medical History:  Diagnosis Date   Crohn disease (HCC)       Past Surgical History:  Procedure Laterality Date   ADENOIDECTOMY Bilateral    TONSILLECTOMY     TREATMENT FISTULA ANAL      Allergies  Allergen Reactions   Gluten Meal    Clindamycin Hives and Rash    Current Outpatient Medications  Medication Sig Dispense Refill   HUMIRA PEN 40 MG/0.4ML PNKT Inject 40 mg into the skin See admin instructions. Every 2 weeks     No current facility-administered medications for this visit.    Family History No family history on file.    Social History Social History   Tobacco Use   Smoking status: Never    Passive exposure: Never   Smokeless tobacco: Current   Tobacco comments:    Zen pouches  Vaping Use   Vaping status: Never Used  Substance Use Topics   Alcohol use: Never   Drug use: Never        Review of Systems  Constitutional: Negative.   HENT: Negative.    Eyes: Negative.   Respiratory: Negative.    Cardiovascular: Negative.   Gastrointestinal: Negative.   Genitourinary: Negative.   Skin: Negative.   Neurological:  Negative.   Psychiatric/Behavioral: Negative.       Physical Exam Blood pressure 96/62, pulse 70, temperature 98 F (36.7 C), height 5\' 9"  (1.753 m), weight 154 lb (69.9 kg), SpO2 97%. Last Weight  Most recent update: 09/10/2023 10:12 AM    Weight  69.9 kg (154 lb)             CONSTITUTIONAL: Well developed, and nourished, appropriately responsive and aware without distress.   EYES: Sclera non-icteric.   EARS, NOSE, MOUTH AND THROAT:  The oropharynx is clear. Oral mucosa is pink and moist.    Hearing is intact to voice.  NECK: Trachea is midline, and there is no jugular venous distension.  LYMPH NODES:  Lymph nodes in the neck are not appreciated. RESPIRATORY:   Normal respiratory effort without pathologic use of accessory muscles. CARDIOVASCULAR:  Well perfused.  GI: The abdomen is  soft, nontender, and nondistended.  GU: At the cephalad aspect of his gluteal cleft, there are 2 parallel scars adjacent to the midline but just off to the left.  There is no erythema, no induration, no tenderness to speak of.  The skin appears to be well adhesed to each side.  There may be a small adjacent punctum.  Closer toward the anal area there is a more prominent punctum but neither are very suspicious.  Nevertheless his active disease process seems to be totally resolved. MUSCULOSKELETAL:  Symmetrical muscle tone appreciated in all four extremities.    SKIN: Skin turgor is normal. No pathologic skin lesions appreciated.  NEUROLOGIC:  Motor and sensation appear grossly normal.  Cranial nerves are grossly without defect. PSYCH:  Alert and oriented to person, place and time. Affect is appropriate for situation.  Data Reviewed I have personally reviewed what is currently available of the patient's imaging, recent labs and medical records.   Labs:     Latest Ref Rng & Units 09/03/2022   11:14 AM 08/26/2020   11:28 PM  CBC  WBC 3.4 - 10.8 x10E3/uL 12.0  16.4   Hemoglobin 13.0 - 17.7 g/dL 86.5  78.4    Hematocrit 37.5 - 51.0 % 41.3  42.8   Platelets 150 - 450 x10E3/uL 271  274       Latest Ref Rng & Units 09/03/2022   11:14 AM 08/26/2020   11:28 PM  CMP  Glucose 70 - 99 mg/dL 93  696   BUN 6 - 20 mg/dL 12  5   Creatinine 2.95 - 1.27 mg/dL 2.84  1.32   Sodium 440 - 144 mmol/L 140  137   Potassium 3.5 - 5.2 mmol/L 4.4  3.4   Chloride 96 - 106 mmol/L 101  103   CO2 20 - 29 mmol/L 19  24   Calcium 8.7 - 10.2 mg/dL 9.3  9.8   Total Protein 6.0 - 8.5 g/dL 7.2  7.8   Total Bilirubin 0.0 - 1.2 mg/dL <1.0  1.0   Alkaline Phos 44 - 121 IU/L 105  99   AST 0 - 40 IU/L 23  16   ALT 0 - 44 IU/L 22  13     Imaging: Radiological images reviewed:   Within last 24 hrs: No results found.  Assessment    Recurrent pilonidal abscess, currently resolved. There are no active problems to display for this patient.   Plan    We discussed surgical options, in detail.  Current priorities for him are to complete his school year, graduate and return home.  Therefore it seems as though there would be no plan for an elective procedure in the near future.  Hopefully he will not have any flareups or recurrence in the interim.  Based on his exam currently I would not anticipate this.  We did discuss it in detail though.   Face-to-face time spent with the patient and accompanying care providers(if present) was 30 minutes, spent counseling, educating, and coordinating care of the patient.    These notes generated with voice recognition software. I apologize for typographical errors.  Flynn Hylan M.D., FACS 09/10/2023, 11:10 AM

## 2023-09-10 ENCOUNTER — Ambulatory Visit (INDEPENDENT_AMBULATORY_CARE_PROVIDER_SITE_OTHER): Payer: Self-pay | Admitting: Surgery

## 2023-09-10 ENCOUNTER — Encounter: Payer: Self-pay | Admitting: Surgery

## 2023-09-10 VITALS — BP 96/62 | HR 70 | Temp 98.0°F | Ht 69.0 in | Wt 154.0 lb

## 2023-09-10 DIAGNOSIS — L0501 Pilonidal cyst with abscess: Secondary | ICD-10-CM | POA: Diagnosis not present

## 2023-09-10 NOTE — Patient Instructions (Signed)
 You have been seen today for a Pilonidal Cyst.   You will need to hold your Humira prior to surgery to aid in wound healing.  You will need to arrange to be out of work for approximately 1-2 weeks and then have a family member change the dressing 1-2 times daily until this heals from the inside out.   If you have FMLA or disability paperwork that needs filled out you may drop this off at our office or this can be faxed to [phone].  Pilonidal Cyst Removal Pilonidal cyst removal is a procedure to remove a fluid-filled sac (cyst) that forms under the skin near the tailbone, at the top of the crease between the buttocks (pilonidal area). This procedure is also called a pilonidal cystectomy.  Pilonidal cyst is caused by an ingrown hair that irritates the area. Sometimes a tunnel (sinus) forms under the skin from the cyst and makes a second opening in the skin. In that case, the sinus area may also be removed during the procedure. You may need this procedure if you have a cyst that is large, painful, or keeps getting infected. A cyst that becomes infected is called an abscess. The abscess may need to be opened, drained, and treated with antibiotics before the cyst is removed. Tell your health care provider about: Any allergies you have. All medicines you are taking, including vitamins, herbs, eye drops, creams, and over-the-counter medicines. Any problems you or family members have had with anesthetic medicines. Any bleeding problems you have. Any surgeries you have had. Any medical conditions you have. Whether you are pregnant or may be pregnant. Any recent fever, increase in pain, or discharge from the cyst. What are the risks? Your health care provider will talk with you about risks. These may include: Delay in healing. This is the most common problem. Infection. Bleeding. Allergic reactions to medicines. A closed incision opening. The cyst coming back again (recurrence). What  happens before the procedure? Medicines Ask your health care provider about: Changing or stopping your regular medicines. These include any diabetes medicines or blood thinners you take. Taking medicines such as aspirin and ibuprofen. These medicines can thin your blood. Do not take them unless your health care provider tells you to. Taking over-the-counter medicines, vitamins, herbs, and supplements. Surgery safety Ask your health care provider: How your surgery site will be marked. What steps will be taken to help prevent infection. These steps may include: Removing hair at the surgery site. Washing skin with a soap that kills germs. Taking antibiotics. General instructions Do not use any products that contain nicotine or tobacco for at least 4 weeks before the procedure. These products include cigarettes, chewing tobacco, and vaping devices, such as e-cigarettes. If you need help quitting, ask your health care provider. If you will be going home right after the procedure, plan to have a responsible adult: Take you home from the hospital or clinic. You will not be allowed to drive. Care for you for the time you are told. You may need help with wound care and dressing changes. What happens during the procedure?  An IV will be inserted into one of your veins. You may be given: A sedative. This helps you relax. Anesthesia. This will: Numb certain areas of your body. Make you fall asleep for surgery. Your surgeon will make an incision near the cyst. Depending on the size of the cyst and if the sinus is infected, one of the following will be done: If there  is an abscess, a small hole will be made in the cyst. The pus will be drained out. If the sinus is large or keeps getting infected, your surgeon may: Cut out the sinus and remove some of the skin around it. The wound will be left open to heal on its own. Remove the sinus and cut out a flap on either side of it. The two sides will be  stitched together. A thin, flexible tube with a camera (endoscope) may be used before this procedure to better see the area. The surgeon may remove hair and infected tissue. The sinus will then be cleaned with a solution. Heat will be used to seal the sinus. The incision may be left open or closed. An open incision may be packed with gauze and covered with a bandage (dressing). An incision may be closed with stitches (sutures) and covered with a dressing. The area may be sealed with fibrin glue and covered with a dressing. The procedure may vary among health care providers and hospitals. What happens after the procedure? Your blood pressure, heart rate, breathing rate, and blood oxygen level will be monitored until you leave the hospital or clinic. You will be given medicine for pain as needed. If you were given a sedative during the procedure, it can affect you for several hours. Do not drive or operate machinery until your health care provider says that it is safe. Your health care provider will give you instructions for taking care of your dressing at home after the procedure. If your incision was left open and packed with gauze, you will need to change your dressing every day. Summary Pilonidal cyst removal is surgery to remove a fluid-filled sac (cyst) that forms in the crease between the buttocks. The incision used to remove the cyst may be closed with sutures or left open. If left open, it may be packed with gauze and covered with a dressing. You will be given medicine for pain as needed. Your health care provider will give you instructions for taking care of your dressing at home. This information is not intended to replace advice given to you by your health care provider. Make sure you discuss any questions you have with your health care provider. Document Revised: 08/16/2021 Document Reviewed: 08/16/2021 Elsevier Patient Education  2024 [employer].
